# Patient Record
Sex: Female | Born: 1980 | Race: White | Hispanic: No | Marital: Married | Smoking: Former smoker
Health system: Southern US, Community
[De-identification: ages and names within clinical notes are randomized; demographics above are authoritative.]

## PROBLEM LIST (undated history)

## (undated) DIAGNOSIS — E079 Disorder of thyroid, unspecified: Secondary | ICD-10-CM

## (undated) HISTORY — PX: TUBAL LIGATION: SHX77

---

## 2013-09-16 ENCOUNTER — Emergency Department (HOSPITAL_COMMUNITY)
Admission: EM | Admit: 2013-09-16 | Discharge: 2013-09-16 | Disposition: A | Discharge status: Home / Self Care | Payer: BC Managed Care – PPO | Attending: Emergency Medicine | Admitting: Emergency Medicine

## 2013-09-16 ENCOUNTER — Encounter (HOSPITAL_COMMUNITY): Payer: Self-pay | Admitting: Emergency Medicine

## 2013-09-16 DIAGNOSIS — Z880 Allergy status to penicillin: Secondary | ICD-10-CM | POA: Insufficient documentation

## 2013-09-16 DIAGNOSIS — T63461A Toxic effect of venom of wasps, accidental (unintentional), initial encounter: Secondary | ICD-10-CM | POA: Insufficient documentation

## 2013-09-16 DIAGNOSIS — Y9301 Activity, walking, marching and hiking: Secondary | ICD-10-CM | POA: Insufficient documentation

## 2013-09-16 DIAGNOSIS — T63441A Toxic effect of venom of bees, accidental (unintentional), initial encounter: Secondary | ICD-10-CM

## 2013-09-16 DIAGNOSIS — T6391XA Toxic effect of contact with unspecified venomous animal, accidental (unintentional), initial encounter: Secondary | ICD-10-CM | POA: Insufficient documentation

## 2013-09-16 DIAGNOSIS — Y9289 Other specified places as the place of occurrence of the external cause: Secondary | ICD-10-CM | POA: Insufficient documentation

## 2013-09-16 DIAGNOSIS — Z87891 Personal history of nicotine dependence: Secondary | ICD-10-CM | POA: Insufficient documentation

## 2013-09-16 DIAGNOSIS — IMO0002 Reserved for concepts with insufficient information to code with codable children: Secondary | ICD-10-CM | POA: Insufficient documentation

## 2013-09-16 MED ORDER — PREDNISONE 10 MG PO TABS: 20.0000 mg | ORAL_TABLET | Freq: Two times a day (BID) | ORAL | Status: DC

## 2013-09-16 MED ORDER — HYDROMORPHONE HCL PF 1 MG/ML IJ SOLN
0.5000 mg | Freq: Once | INTRAMUSCULAR | Status: AC
  Administered 2013-09-16: 0.5 mg via INTRAVENOUS
  Filled 2013-09-16: qty 1

## 2013-09-16 MED ORDER — EPINEPHRINE 0.3 MG/0.3ML IJ SOAJ: 0.3000 mg | INTRAMUSCULAR | Status: AC | PRN

## 2013-09-16 MED ORDER — DEXAMETHASONE SODIUM PHOSPHATE 4 MG/ML IJ SOLN
8.0000 mg | Freq: Once | INTRAMUSCULAR | Status: AC
  Administered 2013-09-16: 8 mg via INTRAVENOUS
  Filled 2013-09-16: qty 2

## 2013-09-16 MED ORDER — ONDANSETRON HCL 4 MG/2ML IJ SOLN
4.0000 mg | Freq: Once | INTRAMUSCULAR | Status: AC
  Administered 2013-09-16: 4 mg via INTRAVENOUS
  Filled 2013-09-16: qty 2

## 2013-09-16 NOTE — ED Provider Notes (Signed)
CSN: [id number]     Arrival date & time 09/16/13  1033 History   First MD Initiated Contact with Patient 09/16/13 1034     Chief Complaint  Patient presents with  . Insect Bite     (Consider location/radiation/quality/duration/timing/severity/associated sxs/prior Treatment) The history is provided by the patient.  Kristie Coleman is a 33 y.o. female who presents to the ED with a bee sting. States she walked out her door and felt a sting in her right calf area. Her daughter saw a yellow jacket. Patient complains of feeling short of breath, chest tightness and having severe shooting pain that starts at the area of the sting and radiates up and down her leg. She complains of redness to the site of the sting. She denies swelling of her throat or difficulty swallowing.   History reviewed. No pertinent past medical history. Past Surgical History  Procedure Laterality Date  . Tubal ligation     History reviewed. No pertinent family history. History  Substance Use Topics  . Smoking status: Former Games developer  . Smokeless tobacco: Not on file  . Alcohol Use: No   OB History   Grav Para Term Preterm Abortions TAB SAB Ect Mult Living                 Review of Systems  HENT: Negative for sore throat and trouble swallowing.   Respiratory: Positive for chest tightness, shortness of breath and wheezing.   Cardiovascular: Negative for chest pain.  Skin: Positive for wound (bee sting to right lower leg. ).  all other systems negative    Allergies  Bactrim; Bee venom; and Penicillins  Home Medications   Prior to Admission medications   Medication Sig Start Date End Date Taking? Authorizing Provider  EPINEPHrine (EPIPEN) 0.3 mg/0.3 mL IJ SOAJ injection Inject 0.3 mLs (0.3 mg total) into the muscle as needed. 09/16/13   Aubriegh Minch Orlene Och, NP  predniSONE (DELTASONE) 10 MG tablet Take 2 tablets (20 mg total) by mouth 2 (two) times daily with a meal. 09/16/13   Xiamara Hulet Orlene Och, NP   BP 136/99  Pulse 88   Temp(Src) 99 F (37.2 C)  Ht 5\' 4"  (1.626 m)  Wt 228 lb (103.42 kg)  BMI 39.12 kg/m2  SpO2 100%  LMP 09/09/2013 Physical Exam  Nursing note and vitals reviewed. Constitutional: She is oriented to person, place, and time. She appears well-developed and well-nourished. No distress.  HENT:  Head: Normocephalic.  Mouth/Throat: Uvula is midline, oropharynx is clear and moist and mucous membranes are normal.  Eyes: EOM are normal.  Neck: Neck supple.  Cardiovascular: Normal rate and regular rhythm.   Pulmonary/Chest: Effort normal. No respiratory distress. She has no wheezes.  Abdominal: Soft. There is no tenderness.  Musculoskeletal: Normal range of motion.  Neurological: She is alert and oriented to person, place, and time. No cranial nerve deficit.  Skin: Skin is warm and dry.  There is a tiny puncture noted at the lateral aspect of the right calf where the patient states the sting occurred. There is surrounding erythema approximately 3 cm.   Psychiatric: She has a normal mood and affect. Her behavior is normal.    ED Course  Procedures  @ 12 noon patient much improved. No wheezing, she no longer feels short of breath.  MDM  33 y.o. female with insect sting to the right lower leg with feeling of chest tightness and shortness of breath that started immediately after the sting. Stable for discharge without  any respiratory problems, no edema of the throat or difficulty swallowing. Will continue prednisone and she will take Benadryl for itching and will make sure she has a Epi Pen with her at all times. Discussed with the patient and all questioned fully answered. She will return if any problems arise.    Medication List         EPINEPHrine 0.3 mg/0.3 mL Soaj injection  Commonly known as:  EPIPEN  Inject 0.3 mLs (0.3 mg total) into the muscle as needed.     predniSONE 10 MG tablet  Commonly known as:  DELTASONE  Take 2 tablets (20 mg total) by mouth 2 (two) times daily with a meal.        BP 104/79  Pulse 88  Temp(Src) 99 F (37.2 C)  Resp 16  Ht 5\' 4"  (1.626 m)  Wt 228 lb (103.42 kg)  BMI 39.12 kg/m2  SpO2 97%  LMP 09/09/2013     Cauy Melody Orlene OchM Tyrique Sporn, NP 09/16/13 1256

## 2013-09-16 NOTE — ED Provider Notes (Signed)
  This was a shared visit with a mid-level provided (NP or PA).  Throughout the patient's course I was available for consultation/collaboration.  I saw the ECG (if appropriate), relevant labs and studies - I agree with the interpretation.  On my exam the patient was in no distress.  She had already received medication. The patient had no prior history of allergic reaction, denies history of prior stings. We discussed prevention, epinephrine autoinjector's, and the patient was discharged in stable condition.      Gerhard Munchobert Hamdi Kley, MD 09/16/13 1343

## 2013-09-16 NOTE — Discharge Instructions (Signed)
I am giving you a prescription for an Epi Pen to keep in the event that you have another bee sting and have a severe allergic reaction. Continue to apply ice to the area, take Benadryl today in addition to the steroids. Return as needed.

## 2013-09-16 NOTE — ED Notes (Signed)
Pt reports was stung by a yellow jacket approximately 15 minutes ago. Pt reports chest tightness,right leg pain,cough,hoarseness, and audible wheezing heard in triage. Airway patent.

## 2014-01-01 ENCOUNTER — Emergency Department (HOSPITAL_COMMUNITY)
Admission: EM | Admit: 2014-01-01 | Discharge: 2014-01-01 | Disposition: A | Discharge status: Home / Self Care | Payer: BC Managed Care – PPO | Attending: Emergency Medicine | Admitting: Emergency Medicine

## 2014-01-01 ENCOUNTER — Encounter (HOSPITAL_COMMUNITY): Payer: Self-pay | Admitting: *Deleted

## 2014-01-01 ENCOUNTER — Emergency Department (HOSPITAL_COMMUNITY): Payer: BC Managed Care – PPO

## 2014-01-01 DIAGNOSIS — Z880 Allergy status to penicillin: Secondary | ICD-10-CM | POA: Insufficient documentation

## 2014-01-01 DIAGNOSIS — J209 Acute bronchitis, unspecified: Secondary | ICD-10-CM | POA: Diagnosis not present

## 2014-01-01 DIAGNOSIS — Z7952 Long term (current) use of systemic steroids: Secondary | ICD-10-CM | POA: Insufficient documentation

## 2014-01-01 DIAGNOSIS — Z87891 Personal history of nicotine dependence: Secondary | ICD-10-CM | POA: Diagnosis not present

## 2014-01-01 DIAGNOSIS — R059 Cough, unspecified: Secondary | ICD-10-CM

## 2014-01-01 DIAGNOSIS — R05 Cough: Secondary | ICD-10-CM | POA: Diagnosis present

## 2014-01-01 DIAGNOSIS — R509 Fever, unspecified: Secondary | ICD-10-CM

## 2014-01-01 MED ORDER — AZITHROMYCIN 250 MG PO TABS: ORAL_TABLET | ORAL | Status: DC

## 2014-01-01 MED ORDER — HYDROCOD POLST-CHLORPHEN POLST 10-8 MG/5ML PO LQCR: 5.0000 mL | Freq: Two times a day (BID) | ORAL | Status: DC | PRN

## 2014-01-01 MED ORDER — ALBUTEROL SULFATE HFA 108 (90 BASE) MCG/ACT IN AERS
2.0000 | INHALATION_SPRAY | RESPIRATORY_TRACT | Status: DC | PRN
  Administered 2014-01-01: 2 via RESPIRATORY_TRACT
  Filled 2014-01-01: qty 6.7

## 2014-01-01 MED ORDER — AZITHROMYCIN 250 MG PO TABS
500.0000 mg | ORAL_TABLET | Freq: Once | ORAL | Status: AC
  Administered 2014-01-01: 500 mg via ORAL
  Filled 2014-01-01: qty 2

## 2014-01-01 MED ORDER — HYDROCOD POLST-CHLORPHEN POLST 10-8 MG/5ML PO LQCR
5.0000 mL | Freq: Once | ORAL | Status: AC
  Administered 2014-01-01: 5 mL via ORAL
  Filled 2014-01-01: qty 5

## 2014-01-01 NOTE — ED Provider Notes (Signed)
CSN: [id number]     Arrival date & time 01/01/14  1457 History   First MD Initiated Contact with Patient [id number]     Chief Complaint  Patient presents with  . Cough     (Consider location/radiation/quality/duration/timing/severity/associated sxs/prior Treatment) The history is provided by the patient and the spouse.   Kristie Coleman is a 33 y.o. female , former smoker who picked up cigarettes again for a week this past month and has developed a persistent unrelenting cough for the past 2 weeks.  She describes a nonproductive cough along with midsternal burning chest pain which is triggered by coughing along with intermittent wheezing which is worsened at night.  She had fevers up to 102 last week, currently gone.  She has been seen by her PCP for this complaint.  She was prescribed prednisone which she plans to pick up at the pharmacy today.  Her husband who has a history of asthma has been sharing his nebulizer with her which will temporarily improve her symptoms.  She she has also taken Delsym maximum strength formula without improvement and has used honey-based cough drops without relief.  She also reports a headache which is worsened with coughing.  She denies nasal congestion or drainage, facial pain or earache.  She has a mild sore throat which she blames on the frequency of cough.   History reviewed. No pertinent past medical history. Past Surgical History  Procedure Laterality Date  . Tubal ligation     No family history on file. History  Substance Use Topics  . Smoking status: Former Games developermoker  . Smokeless tobacco: Not on file  . Alcohol Use: No   OB History    No data available     Review of Systems  Constitutional: Negative for fever.  HENT: Positive for sore throat. Negative for congestion.   Eyes: Negative.   Respiratory: Positive for cough, chest tightness, shortness of breath and wheezing.   Cardiovascular: Negative for chest pain.  Gastrointestinal: Negative for  nausea and abdominal pain.  Genitourinary: Negative.   Musculoskeletal: Negative for joint swelling, arthralgias and neck pain.  Skin: Negative.  Negative for rash and wound.  Neurological: Negative for dizziness, weakness, light-headedness, numbness and headaches.  Psychiatric/Behavioral: Negative.       Allergies  Bactrim; Bee venom; and Penicillins  Home Medications   Prior to Admission medications   Medication Sig Start Date End Date Taking? Authorizing Provider  azithromycin (ZITHROMAX) 250 MG tablet 1 tab po qd starting on 01/02/14 01/01/14   Burgess AmorJulie Asher Torpey, PA-C  chlorpheniramine-HYDROcodone [facility](TUSSIONEX PENNKINETIC ER) 10-8 MG/5ML LQCR Take 5 mLs by mouth every 12 (twelve) hours as needed for cough. 01/01/14   Burgess AmorJulie Phares Zaccone, PA-C  EPINEPHrine (EPIPEN) 0.3 mg/0.3 mL IJ SOAJ injection Inject 0.3 mLs (0.3 mg total) into the muscle as needed. 09/16/13   Hope Orlene OchM Neese, NP  predniSONE (DELTASONE) 10 MG tablet Take 2 tablets (20 mg total) by mouth 2 (two) times daily with a meal. 09/16/13   Hope Orlene OchM Neese, NP   BP 135/86 mmHg  Pulse 84  Temp(Src) 98.6 F (37 C) (Oral)  Resp 20  Ht 5\' 3"  (1.6 m)  Wt 220 lb (99.791 kg)  BMI 38.98 kg/m2  SpO2 95%  LMP  (LMP Unknown) Physical Exam  Constitutional: She appears well-developed and well-nourished.  HENT:  Head: Normocephalic and atraumatic.  Eyes: Conjunctivae are normal.  Neck: Normal range of motion.  Cardiovascular: Normal rate, regular rhythm, normal heart sounds and intact distal pulses.  Pulmonary/Chest: Effort normal and breath sounds normal. No respiratory distress. She has no wheezes. She has no rales. She exhibits no tenderness.  Abdominal: Soft. Bowel sounds are normal. There is no tenderness.  Musculoskeletal: Normal range of motion.  Neurological: She is alert.  Skin: Skin is warm and dry.  Psychiatric: She has a normal mood and affect.  Nursing note and vitals reviewed.   ED Course  Procedures (including critical care  time) Labs Review Labs Reviewed - No data to display  Imaging Review Dg Chest 2 View  01/01/2014   CLINICAL DATA:  Cough and fever for 2 weeks.  EXAM: CHEST  2 VIEW  COMPARISON:  None.  FINDINGS: Normal heart, mediastinum hila. Clear lungs. No pleural effusion or pneumothorax. Bony thorax is unremarkable.  IMPRESSION: Normal chest radiographs.   Electronically Signed   By: Amie Portlandavid  Ormond M.D.   On: 01/01/2014 15:47     EKG Interpretation None      MDM   Final diagnoses:  Acute bronchitis, unspecified organism    Patients labs and/or radiological studies were viewed and considered during the medical decision making and disposition process. The patient was symptoms consistent with acute bronchitis.  She was given an albuterol MDI for every 4 hour when necessary use.  She was encouraged to start her prednisone which is waiting at her pharmacy for her.  She was also placed on Zithromax given the frequency and duration of her symptoms, first dose was given here.  Tussionex for improved cough relief.  Encouraged rest, fluids, recheck by PCP if not improving with these medications.  She is in no respiratory distress at the time of this ED visit, no wheezing, lungs are clear.    Burgess AmorJulie Mackenize Delgadillo, PA-C 01/01/14 1738  Gilda Creasehristopher J. Pollina, MD 01/01/14 1909

## 2014-01-01 NOTE — Discharge Instructions (Signed)
Acute Bronchitis Bronchitis is when the airways that extend from the windpipe into the lungs get red, puffy, and painful (inflamed). Bronchitis often causes thick spit (mucus) to develop. This leads to a cough. A cough is the most common symptom of bronchitis. In acute bronchitis, the condition usually begins suddenly and goes away over time (usually in 2 weeks). Smoking, allergies, and asthma can make bronchitis worse. Repeated episodes of bronchitis may cause more lung problems. HOME CARE  Rest.  Drink enough fluids to keep your pee (urine) clear or pale yellow (unless you need to limit fluids as told by your doctor).  Only take over-the-counter or prescription medicines as told by your doctor.  Avoid smoking and secondhand smoke. These can make bronchitis worse. If you are a smoker, think about using nicotine gum or skin patches. Quitting smoking will help your lungs heal faster.  Reduce the chance of getting bronchitis again by:  Washing your hands often.  Avoiding people with cold symptoms.  Trying not to touch your hands to your mouth, nose, or eyes.  Follow up with your doctor as told. GET HELP IF: Your symptoms do not improve after 1 week of treatment. Symptoms include:  Cough.  Fever.  Coughing up thick spit.  Body aches.  Chest congestion.  Chills.  Shortness of breath.  Sore throat. GET HELP RIGHT AWAY IF:   You have an increased fever.  You have chills.  You have severe shortness of breath.  You have bloody thick spit (sputum).  You throw up (vomit) often.  You lose too much body fluid (dehydration).  You have a severe headache.  You faint. MAKE SURE YOU:   Understand these instructions.  Will watch your condition.  Will get help right away if you are not doing well or get worse. Document Released: 07/31/2007 Document Revised: 10/14/2012 Document Reviewed: 08/04/2012 [employer] Patient Information 2015 [place]. This information is not  intended to replace advice given to you by your health care provider. Make sure you discuss any questions you have with your health care provider.  Take your next dose of zithromax tomorrow.  Get your prescription of prednisone from your doctor started.  Use the cough syrup as instructed.  Use caution as this cough syrup will make you sleepy. Do not drive within 4 hours of taking.  Use the inhaler given 2 puffs every 4 hours if you are wheezing.

## 2014-01-01 NOTE — ED Notes (Signed)
Pt states cough with intermittent fever x 2 weeks. States she has been seen x 4 since symptoms first began.

## 2014-01-09 ENCOUNTER — Encounter (HOSPITAL_COMMUNITY): Payer: Self-pay

## 2014-01-09 ENCOUNTER — Emergency Department (HOSPITAL_COMMUNITY)
Admission: EM | Admit: 2014-01-09 | Discharge: 2014-01-09 | Disposition: A | Discharge status: Home / Self Care | Payer: BC Managed Care – PPO | Attending: Emergency Medicine | Admitting: Emergency Medicine

## 2014-01-09 DIAGNOSIS — J40 Bronchitis, not specified as acute or chronic: Secondary | ICD-10-CM | POA: Diagnosis not present

## 2014-01-09 DIAGNOSIS — Z87891 Personal history of nicotine dependence: Secondary | ICD-10-CM | POA: Diagnosis not present

## 2014-01-09 DIAGNOSIS — Z880 Allergy status to penicillin: Secondary | ICD-10-CM | POA: Insufficient documentation

## 2014-01-09 DIAGNOSIS — R05 Cough: Secondary | ICD-10-CM | POA: Diagnosis present

## 2014-01-09 MED ORDER — BENZONATATE 100 MG PO CAPS: 100.0000 mg | ORAL_CAPSULE | Freq: Three times a day (TID) | ORAL | Status: DC

## 2014-01-09 MED ORDER — ALBUTEROL SULFATE HFA 108 (90 BASE) MCG/ACT IN AERS: 1.0000 | INHALATION_SPRAY | Freq: Four times a day (QID) | RESPIRATORY_TRACT | Status: AC | PRN

## 2014-01-09 MED ORDER — BENZONATATE 100 MG PO CAPS
200.0000 mg | ORAL_CAPSULE | Freq: Once | ORAL | Status: AC
  Administered 2014-01-09: 200 mg via ORAL
  Filled 2014-01-09: qty 2

## 2014-01-09 MED ORDER — PREDNISONE 50 MG PO TABS: ORAL_TABLET | ORAL | Status: DC

## 2014-01-09 MED ORDER — LEVOFLOXACIN 500 MG PO TABS
500.0000 mg | ORAL_TABLET | Freq: Once | ORAL | Status: AC
  Administered 2014-01-09: 500 mg via ORAL
  Filled 2014-01-09: qty 1

## 2014-01-09 MED ORDER — LEVOFLOXACIN 500 MG PO TABS: 500.0000 mg | ORAL_TABLET | Freq: Every day | ORAL | Status: DC

## 2014-01-09 MED ORDER — HYDROCODONE-HOMATROPINE 5-1.5 MG/5ML PO SYRP: 5.0000 mL | ORAL_SOLUTION | Freq: Four times a day (QID) | ORAL | Status: DC | PRN

## 2014-01-09 MED ORDER — HYDROCOD POLST-CHLORPHEN POLST 10-8 MG/5ML PO LQCR
5.0000 mL | Freq: Once | ORAL | Status: AC
  Administered 2014-01-09: 5 mL via ORAL
  Filled 2014-01-09: qty 5

## 2014-01-09 MED ORDER — DEXAMETHASONE SODIUM PHOSPHATE 10 MG/ML IJ SOLN
10.0000 mg | Freq: Once | INTRAMUSCULAR | Status: AC
  Administered 2014-01-09: 10 mg via INTRAMUSCULAR
  Filled 2014-01-09: qty 1

## 2014-01-09 NOTE — ED Provider Notes (Signed)
CSN: [id number]     Arrival date & time 01/09/14  1902 History   This chart was scribed for Donnetta HutchingBrian Berlyn Malina, MD by Evon Slackerrance Branch, ED Scribe. This patient was seen in room APA19/APA19 and the patient's care was started at 7:41 PM.    Chief Complaint  Patient presents with  . Cough    Patient is a 33 y.o. female presenting with cough. The history is provided by the patient. No language interpreter was used.  Cough  HPI Comments: Renetta ChalkFawn Killilea is a 33 y.o. female who presents to the Emergency Department complaining of cough onset 3 weeks ago. States she has associated intermittent fever, chest pain and back pain. She states she has tried an at home breathing treatment with no relief. She states that she has been in the ED twice for similar symptoms. She states she was prescribed antibiotics and Tussionex and has been compliant with taking the medications that temporary relief. Pt states she has tried Tamiflu with no relief of her symptoms. No rusty sputum.  History reviewed. No pertinent past medical history. Past Surgical History  Procedure Laterality Date  . Tubal ligation     History reviewed. No pertinent family history. History  Substance Use Topics  . Smoking status: Former Smoker    Quit date: 08/28/2013  . Smokeless tobacco: Not on file  . Alcohol Use: No   OB History    No data available     Review of Systems  Respiratory: Positive for cough.    A complete 10 system review of systems was obtained and all systems are negative except as noted in the HPI and PMH.    Allergies  Bactrim; Bee venom; and Penicillins  Home Medications   Prior to Admission medications   Medication Sig Start Date End Date Taking? Authorizing Provider  albuterol (PROVENTIL HFA;VENTOLIN HFA) 108 (90 BASE) MCG/ACT inhaler Inhale 1-2 puffs into the lungs every 6 (six) hours as needed for wheezing or shortness of breath. 01/09/14   Donnetta HutchingBrian Teneka Malmberg, MD  azithromycin (ZITHROMAX) 250 MG tablet 1 tab po qd  starting on 01/02/14 01/01/14   Burgess AmorJulie Idol, PA-C  benzonatate (TESSALON) 100 MG capsule Take 1 capsule (100 mg total) by mouth every 8 (eight) hours. 01/09/14   Donnetta HutchingBrian Nilsa Macht, MD  chlorpheniramine-HYDROcodone [employer](TUSSIONEX PENNKINETIC ER) 10-8 MG/5ML LQCR Take 5 mLs by mouth every 12 (twelve) hours as needed for cough. 01/01/14   Burgess AmorJulie Idol, PA-C  EPINEPHrine (EPIPEN) 0.3 mg/0.3 mL IJ SOAJ injection Inject 0.3 mLs (0.3 mg total) into the muscle as needed. 09/16/13   Hope Orlene OchM Neese, NP  HYDROcodone-homatropine (HYCODAN) 5-1.5 MG/5ML syrup Take 5 mLs by mouth every 6 (six) hours as needed for cough. 01/09/14   Donnetta HutchingBrian Ott Zimmerle, MD  levofloxacin (LEVAQUIN) 500 MG tablet Take 1 tablet (500 mg total) by mouth daily. 01/09/14   Donnetta HutchingBrian Carlo Lorson, MD  predniSONE (DELTASONE) 50 MG tablet 1 tablet daily for 6 days, one half tablet daily for 6 days 01/09/14   Donnetta HutchingBrian Amair Shrout, MD   Triage Vitals: BP 127/91 mmHg  Pulse 99  Temp(Src) 98.1 F (36.7 C) (Oral)  Resp 20  Ht 5\' 4"  (1.626 m)  Wt 234 lb 12.8 oz (106.505 kg)  BMI 40.28 kg/m2  SpO2 100%  LMP 12/19/2013  Physical Exam  Constitutional: She is oriented to person, place, and time. She appears well-developed and well-nourished.  Coughing but in no cute distress  HENT:  Head: Normocephalic and atraumatic.  Eyes: Conjunctivae and EOM are normal. Pupils are  equal, round, and reactive to light.  Neck: Normal range of motion. Neck supple.  Cardiovascular: Normal rate, regular rhythm and normal heart sounds.   Pulmonary/Chest: Effort normal and breath sounds normal.  Abdominal: Soft. Bowel sounds are normal.  Musculoskeletal: Normal range of motion.  Neurological: She is alert and oriented to person, place, and time.  Skin: Skin is warm and dry.  Psychiatric: She has a normal mood and affect. Her behavior is normal.  Nursing note and vitals reviewed.   ED Course  Procedures (including critical care time) DIAGNOSTIC STUDIES: Oxygen Saturation is 100% on RA, normal by my  interpretation.    COORDINATION OF CARE: 7:47 PM-Discussed treatment plan which includes prednisone, steroid injection, antibiotics and cough suppressant with pt at bedside and pt agreed to plan.     Labs Review Labs Reviewed - No data to display  Imaging Review No results found.   EKG Interpretation None      MDM   Final diagnoses:  Bronchitis   No clinical evidence of pneumonia. Patient is oxygenating well. Discharge medications albuterol inhaler, prednisone, Levaquin 500 mg, Tessalon Perles, Hycodan cough syrup    I personally performed the services described in this documentation, which was scribed in my presence. The recorded information has been reviewed and is accurate.      Donnetta HutchingBrian Florian Chauca, MD 01/09/14 [phone]

## 2014-01-09 NOTE — ED Notes (Signed)
Patient states cough X3 weeks. Patient states rib/back pain that started this morning.

## 2014-01-09 NOTE — Discharge Instructions (Signed)
Prescriptions for inhaler, cough syrup, cough suppressant, prednisone, antibiotic. Follow-up your primary care doctor. Increase fluids

## 2014-03-17 ENCOUNTER — Encounter (HOSPITAL_COMMUNITY): Payer: Self-pay

## 2014-03-17 ENCOUNTER — Emergency Department (HOSPITAL_COMMUNITY): Payer: 59

## 2014-03-17 ENCOUNTER — Emergency Department (HOSPITAL_COMMUNITY)
Admission: EM | Admit: 2014-03-17 | Discharge: 2014-03-17 | Disposition: A | Discharge status: Home / Self Care | Payer: 59 | Attending: Emergency Medicine | Admitting: Emergency Medicine

## 2014-03-17 DIAGNOSIS — W19XXXA Unspecified fall, initial encounter: Secondary | ICD-10-CM

## 2014-03-17 DIAGNOSIS — Z880 Allergy status to penicillin: Secondary | ICD-10-CM | POA: Insufficient documentation

## 2014-03-17 DIAGNOSIS — S6992XA Unspecified injury of left wrist, hand and finger(s), initial encounter: Secondary | ICD-10-CM | POA: Diagnosis present

## 2014-03-17 DIAGNOSIS — Z87891 Personal history of nicotine dependence: Secondary | ICD-10-CM | POA: Insufficient documentation

## 2014-03-17 DIAGNOSIS — Y9389 Activity, other specified: Secondary | ICD-10-CM | POA: Insufficient documentation

## 2014-03-17 DIAGNOSIS — Y998 Other external cause status: Secondary | ICD-10-CM | POA: Diagnosis not present

## 2014-03-17 DIAGNOSIS — E079 Disorder of thyroid, unspecified: Secondary | ICD-10-CM | POA: Diagnosis not present

## 2014-03-17 DIAGNOSIS — Y9289 Other specified places as the place of occurrence of the external cause: Secondary | ICD-10-CM | POA: Insufficient documentation

## 2014-03-17 DIAGNOSIS — Z79899 Other long term (current) drug therapy: Secondary | ICD-10-CM | POA: Insufficient documentation

## 2014-03-17 DIAGNOSIS — W01198A Fall on same level from slipping, tripping and stumbling with subsequent striking against other object, initial encounter: Secondary | ICD-10-CM | POA: Insufficient documentation

## 2014-03-17 DIAGNOSIS — S0003XA Contusion of scalp, initial encounter: Secondary | ICD-10-CM | POA: Diagnosis not present

## 2014-03-17 DIAGNOSIS — S20212A Contusion of left front wall of thorax, initial encounter: Secondary | ICD-10-CM | POA: Insufficient documentation

## 2014-03-17 DIAGNOSIS — S63502A Unspecified sprain of left wrist, initial encounter: Secondary | ICD-10-CM | POA: Insufficient documentation

## 2014-03-17 HISTORY — DX: Disorder of thyroid, unspecified: E07.9

## 2014-03-17 MED ORDER — HYDROCODONE-ACETAMINOPHEN 5-325 MG PO TABS
1.0000 | ORAL_TABLET | Freq: Once | ORAL | Status: AC
  Administered 2014-03-17: 1 via ORAL
  Filled 2014-03-17: qty 1

## 2014-03-17 MED ORDER — HYDROCODONE-ACETAMINOPHEN 5-325 MG PO TABS: 1.0000 | ORAL_TABLET | Freq: Four times a day (QID) | ORAL | Status: AC | PRN

## 2014-03-17 NOTE — ED Provider Notes (Signed)
CSN: [id number]     Arrival date & time 03/17/14  1539 History   First MD Initiated Contact with Patient 03/17/14 1608     Chief Complaint  Patient presents with  . Fall     (Consider location/radiation/quality/duration/timing/severity/associated sxs/prior Treatment) Patient is a 34 y.o. female presenting with fall. The history is provided by the patient.  Fall This is a new problem. The current episode started today. The problem has been gradually improving. The symptoms are aggravated by twisting and walking. She has tried acetaminophen and NSAIDs for the symptoms. The treatment provided mild relief.   Kristie Coleman is a 34 y.o. female who presents to the ED with left side pain s/p fall at 6:30 am after slipping on the ice. She states that when she hit she hit her head, her left hip, left wrist and shoulder. She got up and went on to work and worked all day. 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 She has been taking tylenol and ibuprofen. She denies LOC. She did knot to the left side of her head that has gone down but is still sore. She complains of pain and swelling to the left wrist. She is ambulatory without difficulty.   Past Medical History  Diagnosis Date  . Thyroid disease    Past Surgical History  Procedure Laterality Date  . Tubal ligation     No family history on file. History  Substance Use Topics  . Smoking status: Former Smoker    Quit date: 08/28/2013  . Smokeless tobacco: Not on file  . Alcohol Use: No   OB History    No data available     Review of Systems Negative except as stated in HPI   Allergies  Aspirin; Bactrim; Bee venom; and Penicillins  Home Medications   Prior to Admission medications   Medication Sig Start Date End Date Taking? Authorizing Provider  levothyroxine (SYNTHROID, LEVOTHROID) 50 MCG tablet Take 50 mcg by mouth daily. 12/20/13  Yes Historical Provider, MD  albuterol (PROVENTIL HFA;VENTOLIN HFA) 108 (90 BASE) MCG/ACT inhaler Inhale 1-2 puffs into the  lungs every 6 (six) hours as needed for wheezing or shortness of breath. 01/09/14   Donnetta HutchingBrian Cook, MD  EPINEPHrine (EPIPEN) 0.3 mg/0.3 mL IJ SOAJ injection Inject 0.3 mLs (0.3 mg total) into the muscle as needed. Patient taking differently: Inject 0.3 mg into the muscle as needed (allergic reaction).  09/16/13   Hope Orlene OchM Neese, NP  HYDROcodone-acetaminophen (NORCO) 5-325 MG per tablet Take 1 tablet by mouth every 6 (six) hours as needed for moderate pain. 03/17/14   Hope Orlene OchM Neese, NP   BP 132/92 mmHg  Pulse 100  Temp(Src) 99.4 F (37.4 C) (Oral)  Resp 20  Ht 5\' 4"  (1.626 m)  Wt 235 lb (106.595 kg)  BMI 40.32 kg/m2  SpO2 100%  LMP 02/24/2014 Physical Exam  Constitutional: She is oriented to person, place, and time. She appears well-developed and well-nourished.  HENT:  Head: Normocephalic. Head is with contusion.    Right Ear: Tympanic membrane normal.  Left Ear: Tympanic membrane normal.  Eyes: Conjunctivae and EOM are normal. Pupils are equal, round, and reactive to light.  Neck: Normal range of motion. Neck supple.  Cardiovascular: Normal rate and regular rhythm.   Pulmonary/Chest: Effort normal. No respiratory distress. She has no wheezes. She exhibits tenderness.    Tenderness on palpation left anterior ribs.   Abdominal: Soft. There is no tenderness.  Musculoskeletal: Normal range of motion. She exhibits no edema.  Left wrist: She exhibits tenderness. She exhibits normal range of motion, no deformity and no laceration. Swelling: minimal.  Radial and pedal pulses 2+, adequate circulation. Ambulatory with steady gait.   Neurological: She is alert and oriented to person, place, and time. She has normal strength and normal reflexes. No cranial nerve deficit or sensory deficit. Gait normal.  Pedal pulses equal.   Skin: Skin is warm and dry.  Psychiatric: She has a normal mood and affect. Her behavior is normal.  Nursing note and vitals reviewed.   ED Course  Procedures  (including critical care time) Labs Review Dg Ribs Unilateral W/chest Left  03/17/2014   CLINICAL DATA:  Slipped on ice and complains of left chest pain.  EXAM: LEFT RIBS AND CHEST - 3+ VIEW  COMPARISON:  Chest radiograph 01/01/2014  FINDINGS: Lungs are clear without a pneumothorax. Heart and mediastinum are within normal limits. The trachea is midline. No evidence for a left rib fracture. There is a small oval-shaped lucency in the left humeral shaft without cortical disruption.  IMPRESSION: No acute chest abnormality.  Negative for a left rib fracture.  There is a small focal lucency in the left humerus. This is likely an incidental finding. No other lucent lesions identified.   Electronically Signed   By: Richarda Overlie M.D.   On: 03/17/2014 17:24   Dg Wrist Complete Left  03/17/2014   CLINICAL DATA:  34 year old female with history of trauma from a fall on and high-speed porch complaining of left wrist pain.  EXAM: LEFT WRIST - COMPLETE 3+ VIEW  COMPARISON:  No priors.  FINDINGS: Multiple views of the left wrist demonstrate no acute displaced fracture, subluxation, dislocation, or soft tissue abnormality.  IMPRESSION: No acute radiographic abnormality of the left wrist.   Electronically Signed   By: Trudie Reed M.D.   On: 03/17/2014 17:21    MDM  34 y.o. female with left rib and scalp contusion and left rib sprain s/p fall earlier today. Stable for discharge without neurovascular deficits. Discussed with the patient clinical and lab findings and plan of care. All questioned fully answered. She will return if any problems arise. Wrist splint applied, ice, rest and pain management.   Final diagnoses:  Fall  Left wrist sprain, initial encounter  Rib contusion, left, initial encounter  Contusion of scalp, initial encounter     [facility], NP 03/17/14 1739  Raeford Razor, MD 03/18/14 1600

## 2014-03-17 NOTE — ED Notes (Addendum)
Pt reports slipped on ice and fell.  C/O pain to left side of body, worse in left wrist.  Also reports hit her head and has a headache.  Says the knot on her head went down after a few hours.  Did not lose consciousness.  Radial pulse present, pt able to make a fist.

## 2014-03-17 NOTE — ED Notes (Signed)
Alert, talking, ambulatory Slipped on ice , pain lt wrist and "lt side of body".

## 2015-09-08 IMAGING — DX DG WRIST COMPLETE 3+V*L*
4 series · 4 of 4 positions shown · non-contrast
Comparison: No priors.

CLINICAL DATA: 33-year-old female with history of trauma from a
fall on and high-speed porch complaining of left wrist pain.

EXAM:
LEFT WRIST - COMPLETE 3+ VIEW

[wrist pa (1 of 2)]
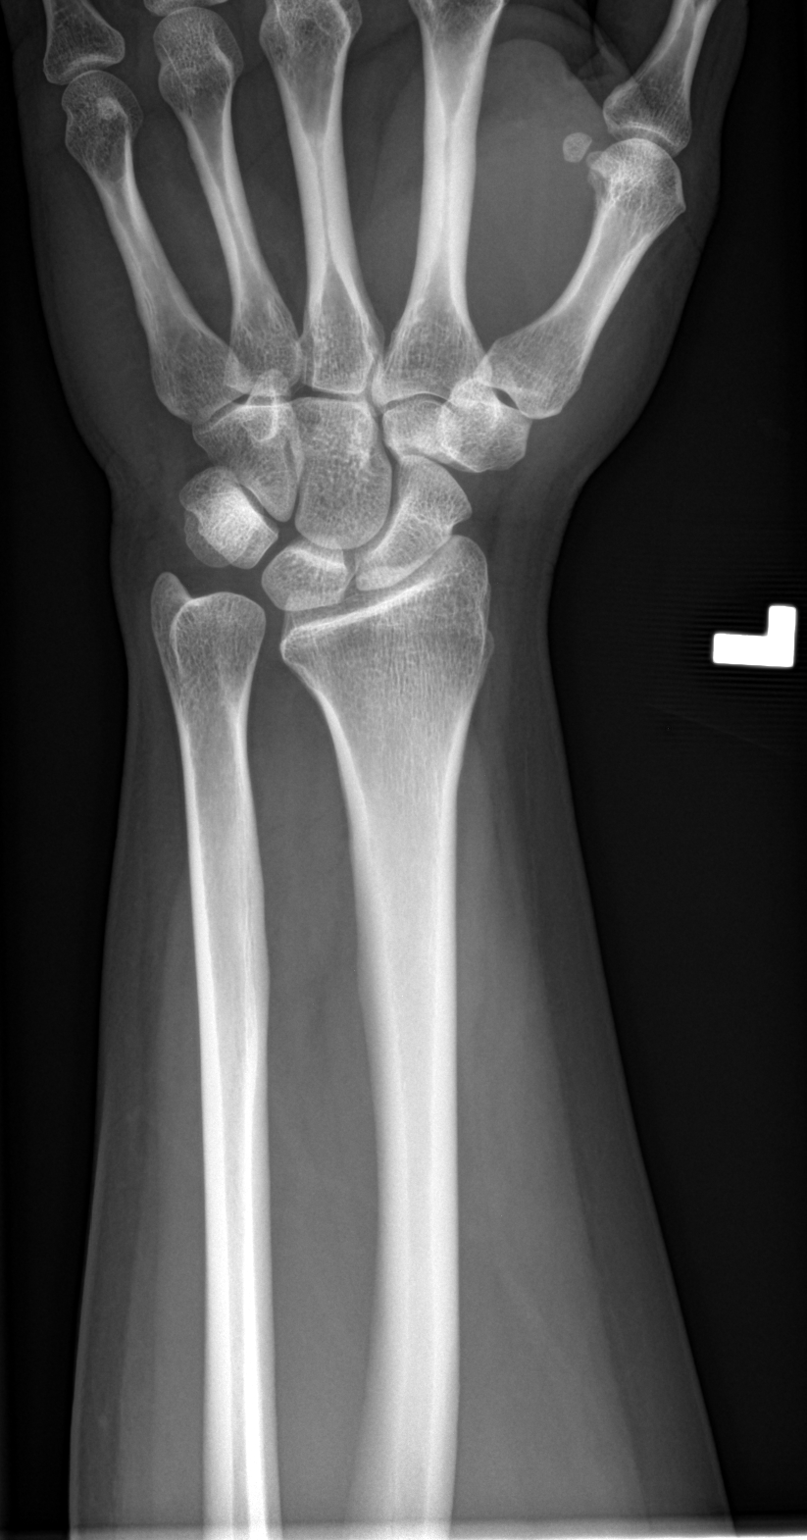

[wrist obl]
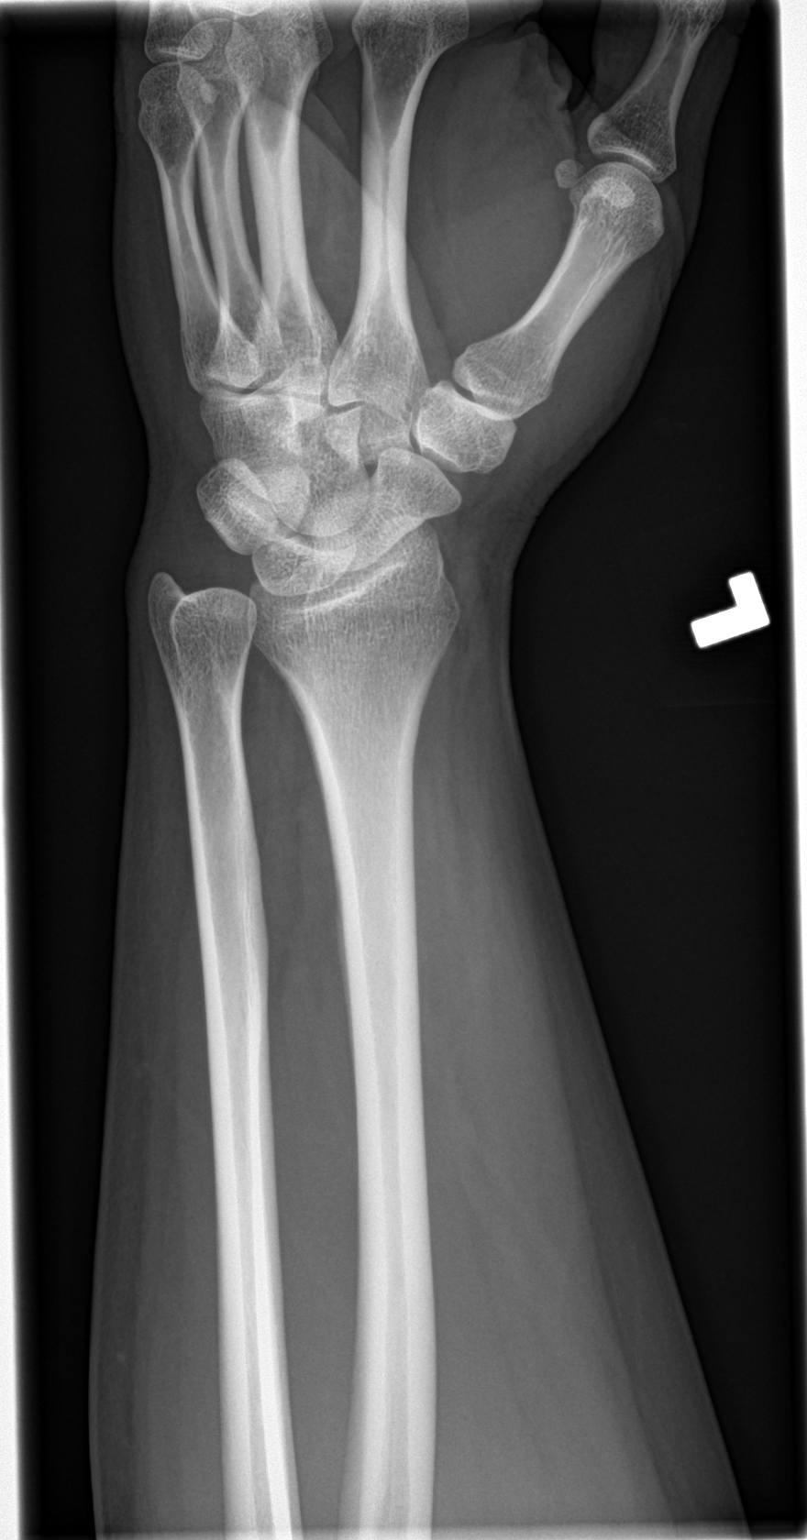

[wrist lat]
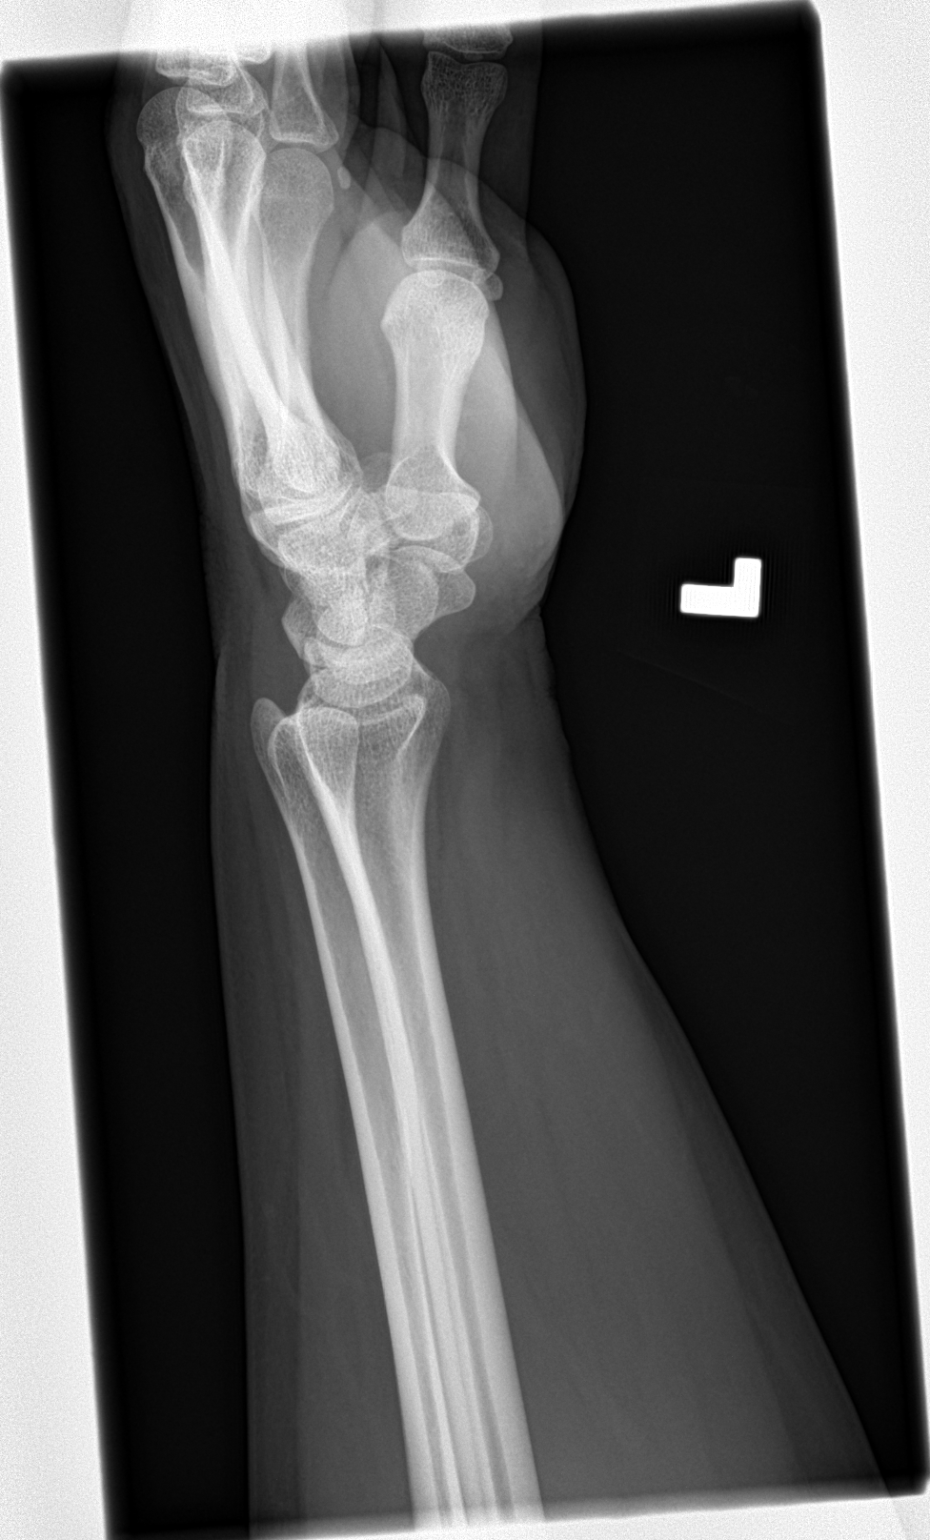

[wrist pa (2 of 2)]
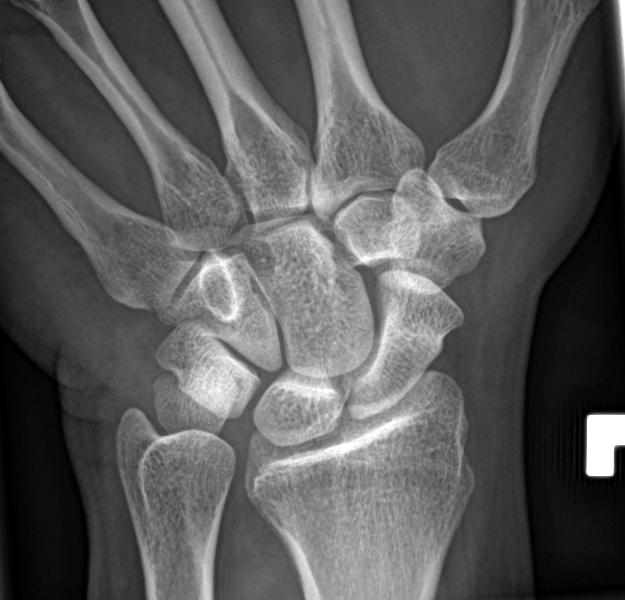

[4 of 4 positions shown; findings below may reference images not displayed]

FINDINGS: Multiple views of the left wrist demonstrate no acute displaced
fracture, subluxation, dislocation, or soft tissue abnormality.
IMPRESSION: No acute radiographic abnormality of the left wrist.
